# Patient Record
Sex: Female | Born: 1975 | Race: Asian | Hispanic: Yes | Marital: Married | Smoking: Never smoker
Health system: Southern US, Community
[De-identification: ages and names within clinical notes are randomized; demographics above are authoritative.]

---

## 2018-11-14 ENCOUNTER — Ambulatory Visit (INDEPENDENT_AMBULATORY_CARE_PROVIDER_SITE_OTHER): Payer: Self-pay

## 2018-11-14 ENCOUNTER — Encounter (HOSPITAL_COMMUNITY): Payer: Self-pay

## 2018-11-14 ENCOUNTER — Ambulatory Visit (HOSPITAL_COMMUNITY)
Admission: EM | Admit: 2018-11-14 | Discharge: 2018-11-14 | Disposition: A | Discharge status: Home / Self Care | Payer: Self-pay | Attending: Physician Assistant | Admitting: Physician Assistant

## 2018-11-14 DIAGNOSIS — W108XXA Fall (on) (from) other stairs and steps, initial encounter: Secondary | ICD-10-CM

## 2018-11-14 DIAGNOSIS — M25511 Pain in right shoulder: Secondary | ICD-10-CM

## 2018-11-14 DIAGNOSIS — W19XXXA Unspecified fall, initial encounter: Secondary | ICD-10-CM

## 2018-11-14 DIAGNOSIS — M79601 Pain in right arm: Secondary | ICD-10-CM

## 2018-11-14 DIAGNOSIS — S5001XA Contusion of right elbow, initial encounter: Secondary | ICD-10-CM

## 2018-11-14 MED ORDER — KETOROLAC TROMETHAMINE 60 MG/2ML IM SOLN
60.0000 mg | Freq: Once | INTRAMUSCULAR | Status: AC
  Administered 2018-11-14: 60 mg via INTRAMUSCULAR

## 2018-11-14 MED ORDER — MELOXICAM 15 MG PO TABS: 15.0000 mg | ORAL_TABLET | Freq: Every day | ORAL | 1 refills | Status: AC

## 2018-11-14 MED ORDER — KETOROLAC TROMETHAMINE 60 MG/2ML IM SOLN
INTRAMUSCULAR | Status: AC
  Filled 2018-11-14: qty 2

## 2018-11-14 NOTE — ED Triage Notes (Signed)
Pt presents with right arm injury after fall yesterday.

## 2018-11-14 NOTE — ED Provider Notes (Signed)
MRN: [id number] DOB: July 22, 1976  Subjective:   Sonia Mack is a 42 y.o. female presenting for 1 day history of right arm injury following accidental fall from steps, fell about 5 steps and absorbed brunt of the impact on her right arm. Pain is sharp, worsening, over her right elbow, forearm with associated bruising, swelling. Has also had swelling of her right hand, pain of her right shoulder. Has tried otc pain medication with minimal relief.   She is not currently taking any medications and has no known food or drug allergies.  Denies past medical and surgical history.  ROS Fever, confusion, dizziness, changes in vision, neck pain, back pain, chest pain, nausea, vomiting, belly pain, dysuria, hematuria, rashes.  Objective:   Vitals: BP (!) 146/103 (BP Location: Left Arm)   Pulse 80   Temp 98.1 F (36.7 C) (Oral)   Resp 20   SpO2 93%   Pulse oximetry was 97% on recheck.  Physical Exam Constitutional:      General: She is not in acute distress.    Appearance: Normal appearance. She is well-developed. She is not ill-appearing.  HENT:     Head: Normocephalic and atraumatic.     Nose: Nose normal.     Mouth/Throat:     Mouth: Mucous membranes are moist.     Pharynx: Oropharynx is clear.  Eyes:     General: No scleral icterus.    Extraocular Movements: Extraocular movements intact.     Pupils: Pupils are equal, round, and reactive to light.  Cardiovascular:     Rate and Rhythm: Normal rate.  Pulmonary:     Effort: Pulmonary effort is normal.  Musculoskeletal:     Right shoulder: She exhibits decreased range of motion, tenderness, pain and decreased strength. She exhibits no swelling, no effusion, no crepitus, no deformity and no laceration.     Right elbow: She exhibits decreased range of motion and swelling. Tenderness found. Medial epicondyle, lateral epicondyle and olecranon process tenderness noted.     Right wrist: She exhibits decreased range of motion, tenderness and  swelling. She exhibits no crepitus, no deformity and no laceration.     Right hand: She exhibits swelling. She exhibits normal range of motion, no tenderness, no bony tenderness, normal capillary refill and no deformity. Normal sensation noted. Normal strength noted.  Skin:    General: Skin is warm and dry.  Neurological:     General: No focal deficit present.     Mental Status: She is alert and oriented to person, place, and time.  Psychiatric:        Mood and Affect: Mood normal.        Behavior: Behavior normal.    Dg Shoulder Right  Result Date: 11/14/2018 CLINICAL DATA:  Right shoulder and arm pain following a fall yesterday. EXAM: RIGHT SHOULDER - 2+ VIEW COMPARISON:  None. FINDINGS: Mild superior spur formation arising from the distal clavicle. Minimal inferior glenohumeral spur formation. No fracture or dislocation. IMPRESSION: 1. No fracture or dislocation. 2. Mild degenerative changes. Electronically Signed   By: Beckie Salts M.D.   On: 11/14/2018 16:22   Dg Forearm Right  Result Date: 11/14/2018 CLINICAL DATA:  Right forearm pain following a fall yesterday. EXAM: RIGHT FOREARM - 2 VIEW COMPARISON:  None. FINDINGS: There is no evidence of fracture or other focal bone lesions. Soft tissues are unremarkable. IMPRESSION: Normal examination. Electronically Signed   By: Beckie Salts M.D.   On: 11/14/2018 16:22   Assessment and Plan :  We will manage conservatively with meloxicam at half to full tablet of 15 mg.  Counseled patient on management of her contusion.  Recheck in 3 to 5 days. Counseled patient on potential for adverse effects with medications prescribed today, patient verbalized understanding. Return-to-clinic precautions discussed, patient verbalized understanding.   Sonia, Mack, [place] 11/14/18 [id number]

## 2020-03-05 IMAGING — DX DG SHOULDER 2+V*R*
3 series · 3 of 3 positions shown · non-contrast
Comparison: None.

CLINICAL DATA: Right shoulder and arm pain following a fall
yesterday.

EXAM:
RIGHT SHOULDER - 2+ VIEW

[shoulder ap]
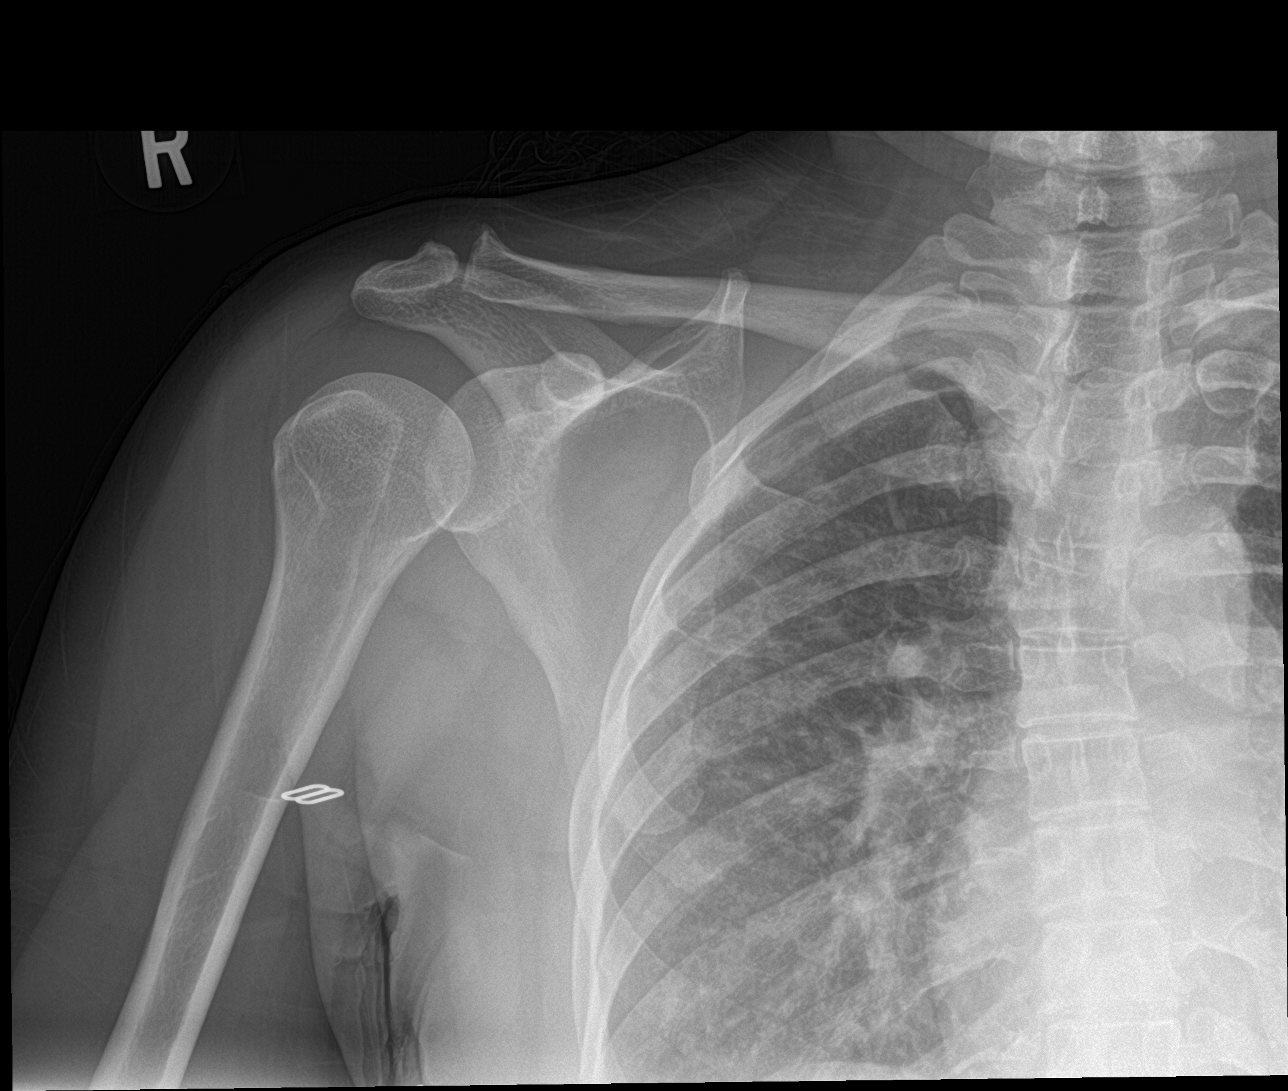

[shoulder grashey]
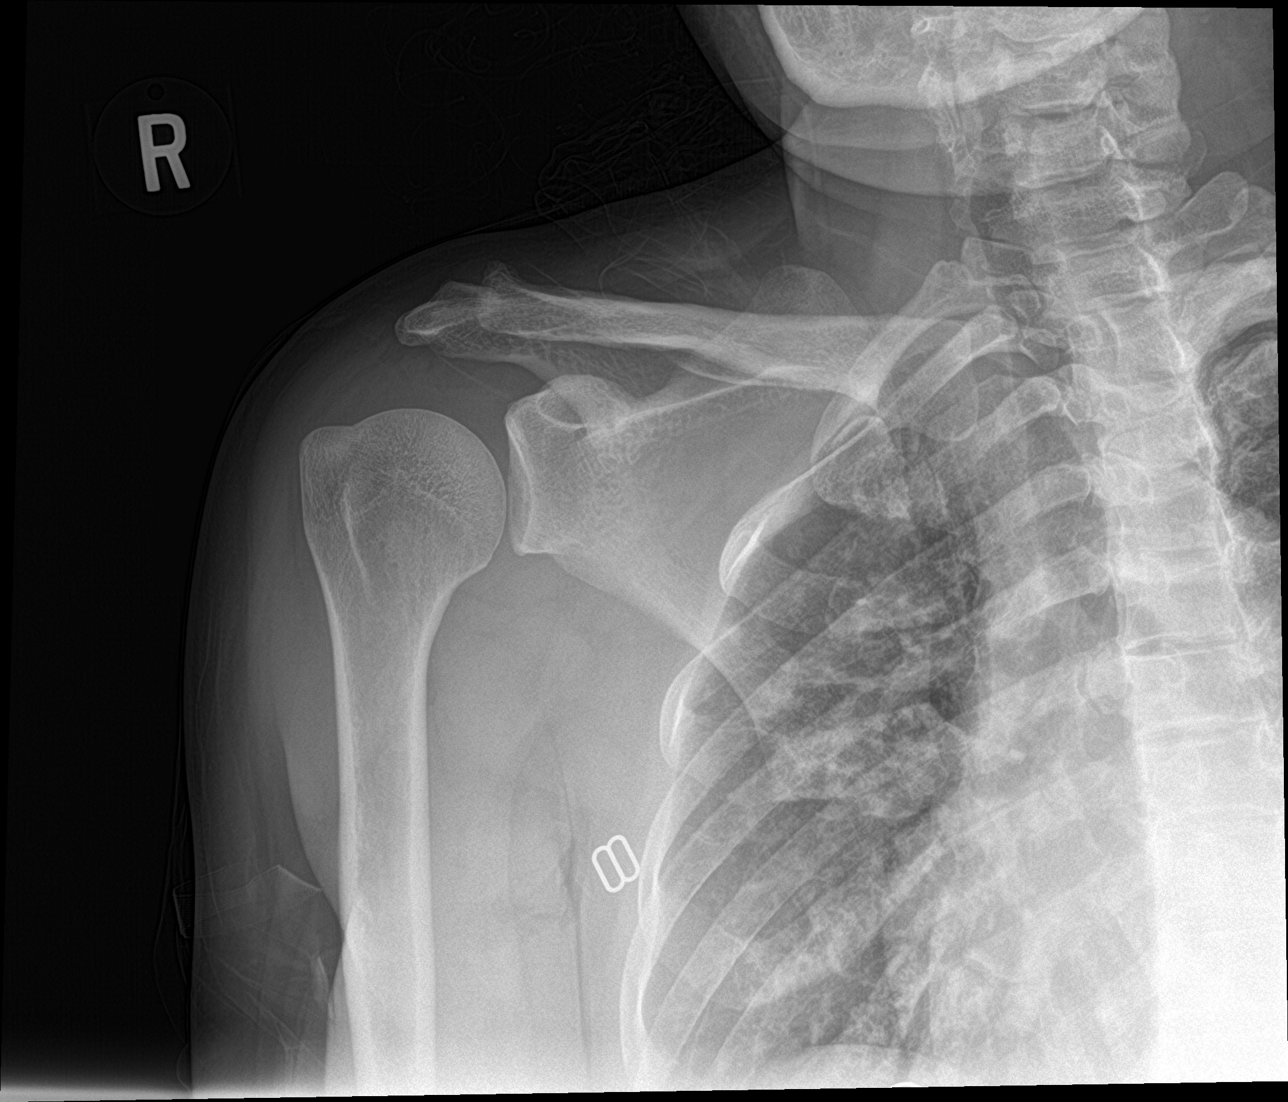

[shoulder y-view]
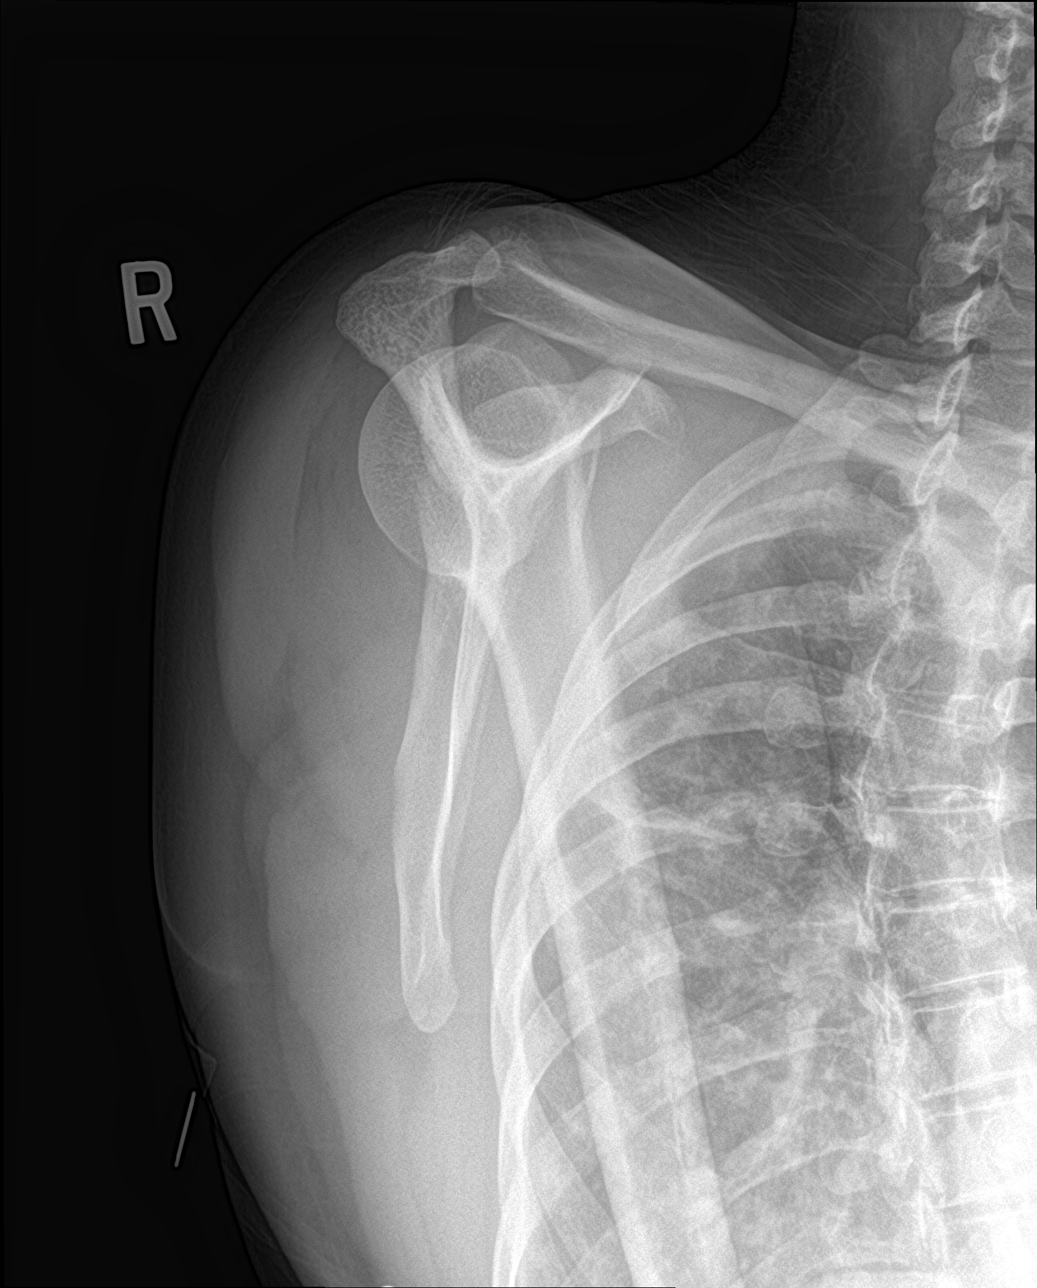

[3 of 3 positions shown; findings below may reference images not displayed]

FINDINGS: Mild superior spur formation arising from the distal clavicle.
Minimal inferior glenohumeral spur formation. No fracture or
dislocation.
IMPRESSION: 1. No fracture or dislocation.
2. Mild degenerative changes.
